# Patient Record
Sex: Male | Born: 1990 | Race: White | Hispanic: No | Marital: Single | Smoking: Former smoker
Health system: Southern US, Community
[De-identification: ages and names within clinical notes are randomized; demographics above are authoritative.]

## PROBLEM LIST (undated history)

## (undated) ENCOUNTER — Ambulatory Visit: Admission: EM

## (undated) HISTORY — PX: WISDOM TOOTH EXTRACTION: SHX21

---

## 2010-05-23 ENCOUNTER — Emergency Department (HOSPITAL_COMMUNITY)
Admission: EM | Admit: 2010-05-23 | Discharge: 2010-05-24 | Payer: Self-pay | Source: Home / Self Care | Admitting: Emergency Medicine

## 2011-04-06 ENCOUNTER — Ambulatory Visit (INDEPENDENT_AMBULATORY_CARE_PROVIDER_SITE_OTHER): Payer: BC Managed Care – PPO

## 2011-04-06 DIAGNOSIS — M25579 Pain in unspecified ankle and joints of unspecified foot: Secondary | ICD-10-CM

## 2011-04-06 DIAGNOSIS — S93409A Sprain of unspecified ligament of unspecified ankle, initial encounter: Secondary | ICD-10-CM
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
## 2013-05-31 ENCOUNTER — Ambulatory Visit (INDEPENDENT_AMBULATORY_CARE_PROVIDER_SITE_OTHER): Payer: 59 | Admitting: Internal Medicine

## 2013-05-31 VITALS — BP 120/68 | HR 94 | Temp 98.6°F | Resp 18 | Ht 69.0 in | Wt 198.0 lb

## 2013-05-31 DIAGNOSIS — R1013 Epigastric pain: Secondary | ICD-10-CM

## 2013-05-31 DIAGNOSIS — K30 Functional dyspepsia: Secondary | ICD-10-CM

## 2013-05-31 DIAGNOSIS — K3189 Other diseases of stomach and duodenum: Secondary | ICD-10-CM

## 2013-05-31 NOTE — Progress Notes (Signed)
° °  Subjective:   This chart was scribed for Ellamae Siaobert Doolittle, MD by Arlan OrganAshley Leger, ED Scribe. This patient was seen in room 13 and the patient's care was started 8:09 PM.    Patient ID: Mark Mccoy, male    DOB: 1990-09-09, 23 y.o.   MRN: [id number]  HPI  HPI Comments: Mark Mccoy is a 23 y.o. male who presents to [facility] complaining a possible ingestion of a toothpick onset 1 day. Pt also reports mild chest pain at time of possible ingestion, now resolved, along with mild abdominal discomfort, very mild and episodic tonight. He states he was chewing the tooth pick, and is unaware if he ingested the remains with other food on his plate while at a super bowl party. He denies any difficulty eating or a sore throat at this time. He states he has not felt any strange sensation in his esophagus in the last 24 hours. Pt has no other pertinent medical history, and denies any other complaints at this time.   Review of Systems  Constitutional: Negative for fever and chills.  Respiratory: Negative for shortness of breath.   Gastrointestinal: Positive for abdominal pain. Negative for nausea and vomiting.    History reviewed. No pertinent past medical history.  Past Surgical History  Procedure Laterality Date   Wisdom tooth extraction      History   Social History   Marital Status: Single    Spouse Name: N/A    Number of Children: N/A   Years of Education: N/A   Occupational History   Not on file.   Social History Main Topics   Smoking status: Former Smoker    Quit date: 11/28/2012   Smokeless tobacco: Never Used   Alcohol Use: Yes     Comment: rare   Drug Use: No   Sexual Activity: Not on file   Other Topics Concern   Not on file   Social History Narrative   No narrative on file    Triage Vitals: BP 120/68   Pulse 94   Temp(Src) 98.6 F (37 C) (Oral)   Resp 18   Ht 5\' 9"  (1.753 m)   Wt 198 lb (89.812 kg)   BMI 29.23 kg/m2   SpO2 99%     Objective:   Physical Exam  Nursing note and vitals reviewed. Constitutional: He is oriented to person, place, and time. He appears well-developed and well-nourished.  HENT:  Head: Normocephalic and atraumatic.  Mouth/Throat: Oropharynx is clear and moist.  Neck: Normal range of motion. Neck supple. No thyromegaly present.  Cardiovascular: Normal rate, regular rhythm and normal heart sounds.   No murmur heard. Pulmonary/Chest: Effort normal and breath sounds normal.  Abdominal: Soft. Bowel sounds are normal. He exhibits no distension and no mass. There is no tenderness. There is no rebound and no guarding.  Musculoskeletal: Normal range of motion.  Lymphadenopathy:    He has no cervical adenopathy.  Neurological: He is alert and oriented to person, place, and time.  Skin: Skin is warm and dry.  Psychiatric: He has a normal mood and affect. His behavior is normal.   Assessment & Plan:  I have completed the patient encounter in its entirety as documented by the scribe, with editing by me where necessary. Robert P. Merla Richesoolittle, M.D. Functional dyspepsia feeling of discomfort but with no objective evidence that foreign body has been ingested  Reassured/followup if any unusual abdominal symptoms

## 2014-02-02 ENCOUNTER — Telehealth: Payer: Self-pay | Admitting: *Deleted

## 2014-02-02 ENCOUNTER — Ambulatory Visit (INDEPENDENT_AMBULATORY_CARE_PROVIDER_SITE_OTHER): Payer: 59

## 2014-02-02 ENCOUNTER — Ambulatory Visit (INDEPENDENT_AMBULATORY_CARE_PROVIDER_SITE_OTHER): Payer: 59 | Admitting: Emergency Medicine

## 2014-02-02 ENCOUNTER — Other Ambulatory Visit: Payer: Self-pay | Admitting: Physician Assistant

## 2014-02-02 VITALS — BP 126/72 | HR 57 | Temp 97.4°F | Resp 16 | Ht 69.75 in | Wt 208.8 lb

## 2014-02-02 DIAGNOSIS — M545 Low back pain, unspecified: Secondary | ICD-10-CM

## 2014-02-02 DIAGNOSIS — R937 Abnormal findings on diagnostic imaging of other parts of musculoskeletal system: Secondary | ICD-10-CM

## 2014-02-02 MED ORDER — CYCLOBENZAPRINE HCL 10 MG PO TABS: 10.0000 mg | ORAL_TABLET | Freq: Three times a day (TID) | ORAL | Status: AC | PRN

## 2014-02-02 NOTE — Progress Notes (Signed)
I have examined this patient along with Ms. Bush, PA-C and agree.  
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
## 2014-02-04 ENCOUNTER — Ambulatory Visit
Admission: RE | Admit: 2014-02-04 | Discharge: 2014-02-04 | Disposition: A | Discharge status: Home / Self Care | Payer: 59 | Source: Ambulatory Visit | Attending: Emergency Medicine | Admitting: Emergency Medicine

## 2014-02-04 DIAGNOSIS — M545 Low back pain, unspecified: Secondary | ICD-10-CM

## 2014-02-04 DIAGNOSIS — R937 Abnormal findings on diagnostic imaging of other parts of musculoskeletal system: Secondary | ICD-10-CM

## 2014-02-07 ENCOUNTER — Telehealth: Payer: Self-pay

## 2014-02-07 ENCOUNTER — Telehealth: Payer: Self-pay | Admitting: Emergency Medicine

## 2014-02-07 NOTE — Telephone Encounter (Signed)
Pt called requesting MRI results. I informed him he has no fracture.  Pt is wondering on work restrictions?  Dr. Cleta Albertsaub, Pt would like you to call him. He said to call him at [phone]

## 2014-02-07 NOTE — Telephone Encounter (Signed)
I called and spoke with the patient. He does have a small disc at L4-L5 which is central and not causing any nerve compression. I told him he could resume work.

## 2014-02-23 NOTE — Telephone Encounter (Signed)
error 

## 2014-06-27 ENCOUNTER — Telehealth: Payer: Self-pay
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
## 2014-06-28 NOTE — Telephone Encounter (Signed)
Processed Release to patient

## 2014-12-28 ENCOUNTER — Encounter (HOSPITAL_BASED_OUTPATIENT_CLINIC_OR_DEPARTMENT_OTHER): Payer: Self-pay | Admitting: *Deleted

## 2014-12-28 ENCOUNTER — Emergency Department (HOSPITAL_BASED_OUTPATIENT_CLINIC_OR_DEPARTMENT_OTHER): Payer: 59

## 2014-12-28 ENCOUNTER — Emergency Department (HOSPITAL_BASED_OUTPATIENT_CLINIC_OR_DEPARTMENT_OTHER)
Admission: EM | Admit: 2014-12-28 | Discharge: 2014-12-28 | Disposition: A | Discharge status: Home / Self Care | Payer: 59 | Attending: Emergency Medicine | Admitting: Emergency Medicine

## 2014-12-28 DIAGNOSIS — Y998 Other external cause status: Secondary | ICD-10-CM | POA: Diagnosis not present

## 2014-12-28 DIAGNOSIS — Y9289 Other specified places as the place of occurrence of the external cause: Secondary | ICD-10-CM | POA: Diagnosis not present

## 2014-12-28 DIAGNOSIS — S0990XA Unspecified injury of head, initial encounter: Secondary | ICD-10-CM | POA: Diagnosis present

## 2014-12-28 DIAGNOSIS — S060X0A Concussion without loss of consciousness, initial encounter: Secondary | ICD-10-CM | POA: Diagnosis not present

## 2014-12-28 DIAGNOSIS — Y9389 Activity, other specified: Secondary | ICD-10-CM | POA: Diagnosis not present

## 2014-12-28 DIAGNOSIS — Z87891 Personal history of nicotine dependence: Secondary | ICD-10-CM | POA: Insufficient documentation

## 2014-12-28 DIAGNOSIS — W228XXA Striking against or struck by other objects, initial encounter: Secondary | ICD-10-CM | POA: Diagnosis not present
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
## 2014-12-28 NOTE — ED Notes (Signed)
MD at bedside. 
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
## 2014-12-28 NOTE — ED Notes (Signed)
Patient transported to x-ray. ?

## 2014-12-28 NOTE — Discharge Instructions (Signed)
It was our pleasure to provide your ER care today - we hope that you feel better.  Rest. Drink adequate fluids.  Take acetaminophen or ibuprofen as need.  Follow up with primary care doctor in the next 1-2 weeks if symptoms fail to improve/resolve.  No contact sports, tubing, etc, until cleared to do so by your doctor.  Return to ER if worse, new symptoms, acute/severe headache, persistent vomiting, other concern.     Head Injury You have received a head injury. It does not appear serious at this time. Headaches and vomiting are common following head injury. It should be easy to awaken from sleeping. Sometimes it is necessary for you to stay in the emergency department for a while for observation. Sometimes admission to the hospital may be needed. After injuries such as yours, most problems occur within the first 24 hours, but side effects may occur up to 7-10 days after the injury. It is important for you to carefully monitor your condition and contact your health care provider or seek immediate medical care if there is a change in your condition. WHAT ARE THE TYPES OF HEAD INJURIES? Head injuries can be as minor as a bump. Some head injuries can be more severe. More severe head injuries include:  A jarring injury to the brain (concussion).  A bruise of the brain (contusion). This mean there is bleeding in the brain that can cause swelling.  A cracked skull (skull fracture).  Bleeding in the brain that collects, clots, and forms a bump (hematoma). WHAT CAUSES A HEAD INJURY? A serious head injury is most likely to happen to someone who is in a car wreck and is not wearing a seat belt. Other causes of major head injuries include bicycle or motorcycle accidents, sports injuries, and falls. HOW ARE HEAD INJURIES DIAGNOSED? A complete history of the event leading to the injury and your current symptoms will be helpful in diagnosing head injuries. Many times, pictures of the brain, such as  CT or MRI are needed to see the extent of the injury. Often, an overnight hospital stay is necessary for observation.  WHEN SHOULD I SEEK IMMEDIATE MEDICAL CARE?  You should get help right away if:  You have confusion or drowsiness.  You feel sick to your stomach (nauseous) or have continued, forceful vomiting.  You have dizziness or unsteadiness that is getting worse.  You have severe, continued headaches not relieved by medicine. Only take over-the-counter or prescription medicines for pain, fever, or discomfort as directed by your health care provider.  You do not have normal function of the arms or legs or are unable to walk.  You notice changes in the black spots in the center of the colored part of your eye (pupil).  You have a clear or bloody fluid coming from your nose or ears.  You have a loss of vision. During the next 24 hours after the injury, you must stay with someone who can watch you for the warning signs. This person should contact local emergency services (911 in the [place] if you have seizures, you become unconscious, or you are unable to wake up. HOW CAN I PREVENT A HEAD INJURY IN THE FUTURE? The most important factor for preventing major head injuries is avoiding motor vehicle accidents. To minimize the potential for damage to your head, it is crucial to wear seat belts while riding in motor vehicles. Wearing helmets while bike riding and playing collision sports (like football) is also helpful. Also, avoiding  dangerous activities around the house will further help reduce your risk of head injury.  WHEN CAN I RETURN TO NORMAL ACTIVITIES AND ATHLETICS? You should be reevaluated by your health care provider before returning to these activities. If you have any of the following symptoms, you should not return to activities or contact sports until 1 week after the symptoms have stopped:  Persistent headache.  Dizziness or vertigo.  Poor attention and  concentration.  Confusion.  Memory problems.  Nausea or vomiting.  Fatigue or tire easily.  Irritability.  Intolerant of bright lights or loud noises.  Anxiety or depression.  Disturbed sleep. MAKE SURE YOU:   Understand these instructions.  Will watch your condition.  Will get help right away if you are not doing well or get worse. Document Released: 04/15/2005 Document Revised: 04/20/2013 Document Reviewed: 12/21/2012 [facility] Patient Information 2015 [place], [place]. This information is not intended to replace advice given to you by your health care provider. Make sure you discuss any questions you have with your health care provider.      Concussion A concussion, or closed-head injury, is a brain injury caused by a direct blow to the head or by a quick and sudden movement (jolt) of the head or neck. Concussions are usually not life-threatening. Even so, the effects of a concussion can be serious. If you have had a concussion before, you are more likely to experience concussion-like symptoms after a direct blow to the head.  CAUSES  Direct blow to the head, such as from running into another player during a soccer game, being hit in a fight, or hitting your head on a hard surface.  A jolt of the head or neck that causes the brain to move back and forth inside the skull, such as in a car crash. SIGNS AND SYMPTOMS The signs of a concussion can be hard to notice. Early on, they may be missed by you, family members, and health care providers. You may look fine but act or feel differently. Symptoms are usually temporary, but they may last for days, weeks, or even longer. Some symptoms may appear right away while others may not show up for hours or days. Every head injury is different. Symptoms include:  Mild to moderate headaches that will not go away.  A feeling of pressure inside your head.  Having more trouble than usual:  Learning or remembering things you have  heard.  Answering questions.  Paying attention or concentrating.  Organizing daily tasks.  Making decisions and solving problems.  Slowness in thinking, acting or reacting, speaking, or reading.  Getting lost or being easily confused.  Feeling tired all the time or lacking energy (fatigued).  Feeling drowsy.  Sleep disturbances.  Sleeping more than usual.  Sleeping less than usual.  Trouble falling asleep.  Trouble sleeping (insomnia).  Loss of balance or feeling lightheaded or dizzy.  Nausea or vomiting.  Numbness or tingling.  Increased sensitivity to:  Sounds.  Lights.  Distractions.  Vision problems or eyes that tire easily.  Diminished sense of taste or smell.  Ringing in the ears.  Mood changes such as feeling sad or anxious.  Becoming easily irritated or angry for little or no reason.  Lack of motivation.  Seeing or hearing things other people do not see or hear (hallucinations). DIAGNOSIS Your health care provider can usually diagnose a concussion based on a description of your injury and symptoms. He or she will ask whether you passed out (lost consciousness) and  whether you are having trouble remembering events that happened right before and during your injury. Your evaluation might include:  A brain scan to look for signs of injury to the brain. Even if the test shows no injury, you may still have a concussion.  Blood tests to be sure other problems are not present. TREATMENT  Concussions are usually treated in an emergency department, in urgent care, or at a clinic. You may need to stay in the hospital overnight for further treatment.  Tell your health care provider if you are taking any medicines, including prescription medicines, over-the-counter medicines, and natural remedies. Some medicines, such as blood thinners (anticoagulants) and aspirin, may increase the chance of complications. Also tell your health care provider whether you  have had alcohol or are taking illegal drugs. This information may affect treatment.  Your health care provider will send you home with important instructions to follow.  How fast you will recover from a concussion depends on many factors. These factors include how severe your concussion is, what part of your brain was injured, your age, and how healthy you were before the concussion.  Most people with mild injuries recover fully. Recovery can take time. In general, recovery is slower in older persons. Also, persons who have had a concussion in the past or have other medical problems may find that it takes longer to recover from their current injury. HOME CARE INSTRUCTIONS General Instructions  Carefully follow the directions your health care provider gave you.  Only take over-the-counter or prescription medicines for pain, discomfort, or fever as directed by your health care provider.  Take only those medicines that your health care provider has approved.  Do not drink alcohol until your health care provider says you are well enough to do so. Alcohol and certain other drugs may slow your recovery and can put you at risk of further injury.  If it is harder than usual to remember things, write them down.  If you are easily distracted, try to do one thing at a time. For example, do not try to watch TV while fixing dinner.  Talk with family members or close friends when making important decisions.  Keep all follow-up appointments. Repeated evaluation of your symptoms is recommended for your recovery.  Watch your symptoms and tell others to do the same. Complications sometimes occur after a concussion. Older adults with a brain injury may have a higher risk of serious complications, such as a blood clot on the brain.  Tell your teachers, school nurse, school counselor, coach, athletic trainer, or work Production designer, theatre/television/film about your injury, symptoms, and restrictions. Tell them about what you can or  cannot do. They should watch for:  Increased problems with attention or concentration.  Increased difficulty remembering or learning new information.  Increased time needed to complete tasks or assignments.  Increased irritability or decreased ability to cope with stress.  Increased symptoms.  Rest. Rest helps the brain to heal. Make sure you:  Get plenty of sleep at night. Avoid staying up late at night.  Keep the same bedtime hours on weekends and weekdays.  Rest during the day. Take daytime naps or rest breaks when you feel tired.  Limit activities that require a lot of thought or concentration. These include:  Doing homework or job-related work.  Watching TV.  Working on the computer.  Avoid any situation where there is potential for another head injury (football, hockey, soccer, basketball, martial arts, downhill snow sports and horseback riding). Your  condition will get worse every time you experience a concussion. You should avoid these activities until you are evaluated by the appropriate follow-up health care providers. Returning To Your Regular Activities You will need to return to your normal activities slowly, not all at once. You must give your body and brain enough time for recovery.  Do not return to sports or other athletic activities until your health care provider tells you it is safe to do so.  Ask your health care provider when you can drive, ride a bicycle, or operate heavy machinery. Your ability to react may be slower after a brain injury. Never do these activities if you are dizzy.  Ask your health care provider about when you can return to work or school. Preventing Another Concussion It is very important to avoid another brain injury, especially before you have recovered. In rare cases, another injury can lead to permanent brain damage, brain swelling, or death. The risk of this is greatest during the first 7-10 days after a head injury. Avoid injuries  by:  Wearing a seat belt when riding in a car.  Drinking alcohol only in moderation.  Wearing a helmet when biking, skiing, skateboarding, skating, or doing similar activities.  Avoiding activities that could lead to a second concussion, such as contact or recreational sports, until your health care provider says it is okay.  Taking safety measures in your home.  Remove clutter and tripping hazards from floors and stairways.  Use grab bars in bathrooms and handrails by stairs.  Place non-slip mats on floors and in bathtubs.  Improve lighting in dim areas. SEEK MEDICAL CARE IF:  You have increased problems paying attention or concentrating.  You have increased difficulty remembering or learning new information.  You need more time to complete tasks or assignments than before.  You have increased irritability or decreased ability to cope with stress.  You have more symptoms than before. Seek medical care if you have any of the following symptoms for more than 2 weeks after your injury:  Lasting (chronic) headaches.  Dizziness or balance problems.  Nausea.  Vision problems.  Increased sensitivity to noise or light.  Depression or mood swings.  Anxiety or irritability.  Memory problems.  Difficulty concentrating or paying attention.  Sleep problems.  Feeling tired all the time. SEEK IMMEDIATE MEDICAL CARE IF:  You have severe or worsening headaches. These may be a sign of a blood clot in the brain.  You have weakness (even if only in one hand, leg, or part of the face).  You have numbness.  You have decreased coordination.  You vomit repeatedly.  You have increased sleepiness.  One pupil is larger than the other.  You have convulsions.  You have slurred speech.  You have increased confusion. This may be a sign of a blood clot in the brain.  You have increased restlessness, agitation, or irritability.  You are unable to recognize people or  places.  You have neck pain.  It is difficult to wake you up.  You have unusual behavior changes.  You lose consciousness. MAKE SURE YOU:  Understand these instructions.  Will watch your condition.  Will get help right away if you are not doing well or get worse. Document Released: 07/06/2003 Document Revised: 04/20/2013 Document Reviewed: 11/05/2012 [facility] Patient Information 2015 [place], [place]. This information is not intended to replace advice given to you by your health care provider. Make sure you discuss any questions you have with your health  care provider.
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
## 2020-11-30 ENCOUNTER — Encounter (HOSPITAL_BASED_OUTPATIENT_CLINIC_OR_DEPARTMENT_OTHER): Payer: Self-pay | Admitting: Emergency Medicine

## 2020-11-30 ENCOUNTER — Emergency Department (HOSPITAL_BASED_OUTPATIENT_CLINIC_OR_DEPARTMENT_OTHER): Payer: BC Managed Care – PPO

## 2020-11-30 ENCOUNTER — Other Ambulatory Visit: Payer: Self-pay

## 2020-11-30 ENCOUNTER — Emergency Department (HOSPITAL_BASED_OUTPATIENT_CLINIC_OR_DEPARTMENT_OTHER)
Admission: EM | Admit: 2020-11-30 | Discharge: 2020-11-30 | Disposition: A | Discharge status: Home / Self Care | Payer: BC Managed Care – PPO | Attending: Emergency Medicine | Admitting: Emergency Medicine

## 2020-11-30 DIAGNOSIS — R519 Headache, unspecified: Secondary | ICD-10-CM | POA: Diagnosis present

## 2020-11-30 DIAGNOSIS — Z87891 Personal history of nicotine dependence: Secondary | ICD-10-CM | POA: Insufficient documentation

## 2020-11-30 MED ORDER — OXYCODONE-ACETAMINOPHEN 5-325 MG PO TABS
2.0000 | ORAL_TABLET | Freq: Once | ORAL | Status: AC
  Administered 2020-11-30: 2 via ORAL
  Filled 2020-11-30: qty 2

## 2020-11-30 NOTE — Discharge Instructions (Signed)
Please establish primary care. Return if you are having worsening symptoms especially fever, lateralized weakness, or other new symptoms.

## 2020-11-30 NOTE — ED Provider Notes (Signed)
[facility] EMERGENCY DEPT Provider Note   CSN: [id number] Arrival date & time: 11/30/20  1955     History Chief Complaint  Patient presents with   Headache    Mark Mccoy is a 30 y.o. male.  HPI   30 yo male complaining headache for one week.  Pain is migratory around head.  He has had new glasses but does not feel this is contributing.  No fever, nasal d/c, sore throat, nausea or vomiting.  Patient has taken otc meds without.   History reviewed. No pertinent past medical history.  There are no problems to display for this patient.   Past Surgical History:  Procedure Laterality Date   WISDOM TOOTH EXTRACTION         History reviewed. No pertinent family history.  Social History   Tobacco Use   Smoking status: Former    Types: Cigarettes    Quit date: 11/28/2012    Years since quitting: 8.0   Smokeless tobacco: Never  Vaping Use   Vaping Use: Every day  Substance Use Topics   Alcohol use: Yes    Comment: 2x per month   Drug use: No    Home Medications Prior to Admission medications   Medication Sig Start Date End Date Taking? Authorizing Provider  cyclobenzaprine (FLEXERIL) 10 MG tablet Take 1 tablet (10 mg total) by mouth 3 (three) times daily as needed for muscle spasms. 02/02/14   Dorna Leitz, PA-C    Allergies    Patient has no known allergies.  Review of Systems   Review of Systems  All other systems reviewed and are negative.  Physical Exam Updated Vital Signs BP 134/89 (BP Location: Right Arm)   Pulse 69   Temp 98.5 F (36.9 C) (Oral)   Resp 16   Ht 1.778 m (5\' 10" )   Wt 93 kg   SpO2 98%   BMI 29.41 kg/m   Physical Exam Vitals and nursing note reviewed.  HENT:     Head: Normocephalic.     Mouth/Throat:     Mouth: Mucous membranes are moist.  Eyes:     Extraocular Movements: Extraocular movements intact.  Cardiovascular:     Rate and Rhythm: Normal rate and regular rhythm.     Heart sounds: Normal heart  sounds.  Pulmonary:     Effort: Pulmonary effort is normal.     Breath sounds: Normal breath sounds.  Abdominal:     Palpations: Abdomen is soft.  Musculoskeletal:        General: Normal range of motion.     Cervical back: Normal range of motion and neck supple.  Skin:    General: Skin is warm and dry.     Capillary Refill: Capillary refill takes less than 2 seconds.  Neurological:     Mental Status: He is alert.    ED Results / Procedures / Treatments   Labs (all labs ordered are listed, but only abnormal results are displayed) Labs Reviewed - No data to display  EKG None  Radiology CT HEAD WO CONTRAST ( )  Result Date: 11/30/2020 CLINICAL DATA:  Headache EXAM: CT HEAD WITHOUT CONTRAST TECHNIQUE: Contiguous axial images were obtained from the base of the skull through the vertex without intravenous contrast. COMPARISON:  CT brain 12/28/2014 FINDINGS: Brain: No evidence of acute infarction, hemorrhage, hydrocephalus, extra-axial collection or mass lesion/mass effect. Vascular: No hyperdense vessel or unexpected calcification. Skull: Normal. Negative for fracture or focal lesion. Sinuses/Orbits: No acute finding. Other: None  IMPRESSION: Negative non contrasted CT appearance of the brain Electronically Signed   By: Jasmine Pang M.D.   On: 11/30/2020 21:12    Procedures Procedures   Medications Ordered in ED Medications  oxyCODONE-acetaminophen (PERCOCET/ROXICET) 5-325 MG per tablet 2 tablet (2 tablets Oral Given 11/30/20 2047)    ED Course  I have reviewed the triage vital signs and the nursing notes.  Pertinent labs & imaging results that were available during my care of the patient were reviewed by me and considered in my medical decision making (see chart for details).    MDM Rules/Calculators/A&P                            Final Clinical Impression(s) / ED Diagnoses Final diagnoses:  Acute intractable headache, unspecified headache type    Rx / DC Orders ED  Discharge Orders     None        Margarita Grizzle, MD 12/01/20 1502

## 2020-11-30 NOTE — ED Triage Notes (Signed)
Pt via pov from home with headache x 1 week. Pt states it has been almost constant, with some short breaks. Pt also reports intermittent dizziness or "kind of a daze" but not today. No hx of headaches. Pt alert & oriented, nad noted.

## 2021-11-19 DIAGNOSIS — J029 Acute pharyngitis, unspecified: Secondary | ICD-10-CM | POA: Diagnosis not present

## 2023-10-30 ENCOUNTER — Other Ambulatory Visit: Payer: Self-pay | Admitting: Family Medicine

## 2023-10-30 DIAGNOSIS — N50812 Left testicular pain: Secondary | ICD-10-CM

## 2023-11-04 ENCOUNTER — Ambulatory Visit
Admission: RE | Admit: 2023-11-04 | Discharge: 2023-11-04 | Disposition: A | Discharge status: Home / Self Care | Source: Ambulatory Visit | Attending: Family Medicine | Admitting: Family Medicine

## 2023-11-04 DIAGNOSIS — N50812 Left testicular pain: Secondary | ICD-10-CM | POA: Diagnosis present
